# Patient Record
Sex: Female | Born: 1980 | Marital: Single | Smoking: Current every day smoker
Health system: Southern US, Community
[De-identification: ages and names within clinical notes are randomized; demographics above are authoritative.]

---

## 2002-03-16 ENCOUNTER — Inpatient Hospital Stay (HOSPITAL_COMMUNITY): Admission: AD | Admit: 2002-03-16 | Discharge: 2002-03-16 | Payer: Self-pay | Admitting: *Deleted

## 2004-06-22 ENCOUNTER — Ambulatory Visit (HOSPITAL_COMMUNITY): Admission: RE | Admit: 2004-06-22 | Discharge: 2004-06-22 | Payer: Self-pay | Admitting: Gynecology

## 2004-07-27 ENCOUNTER — Observation Stay (HOSPITAL_COMMUNITY): Admission: RE | Admit: 2004-07-27 | Discharge: 2004-07-28 | Payer: Self-pay | Admitting: Gynecology

## 2006-04-18 ENCOUNTER — Emergency Department: Payer: Self-pay | Admitting: Unknown Physician Specialty

## 2008-08-01 ENCOUNTER — Emergency Department: Payer: Self-pay | Admitting: Emergency Medicine

## 2009-12-02 ENCOUNTER — Emergency Department: Payer: Self-pay | Admitting: Unknown Physician Specialty

## 2010-01-04 ENCOUNTER — Inpatient Hospital Stay: Payer: Self-pay | Admitting: Psychiatry

## 2011-12-09 ENCOUNTER — Emergency Department: Payer: Self-pay | Admitting: *Deleted

## 2011-12-09 LAB — CBC WITH DIFFERENTIAL/PLATELET
Basophil %: 0.5 %
Eosinophil %: 0.5 %
Lymphocyte #: 3.1 10*3/uL (ref 1.0–3.6)
Lymphocyte %: 18.1 %
MCH: 33.7 pg (ref 26.0–34.0)
MCHC: 35.2 g/dL (ref 32.0–36.0)
Monocyte %: 6.5 %
Neutrophil #: 12.7 10*3/uL — ABNORMAL HIGH (ref 1.4–6.5)
Neutrophil %: 74.4 %
Platelet: 123 10*3/uL — ABNORMAL LOW (ref 150–440)
RDW: 13.3 % (ref 11.5–14.5)

## 2011-12-09 LAB — URINALYSIS, COMPLETE
Bilirubin,UR: NEGATIVE
Glucose,UR: NEGATIVE mg/dL (ref 0–75)
Ketone: NEGATIVE
Nitrite: NEGATIVE
Squamous Epithelial: <1

## 2011-12-09 LAB — BASIC METABOLIC PANEL
Anion Gap: 9 (ref 7–16)
Chloride: 107 mmol/L (ref 98–107)
Co2: 24 mmol/L (ref 21–32)
Creatinine: 0.8 mg/dL (ref 0.60–1.30)
EGFR (African American): >60
Osmolality: 278 (ref 275–301)

## 2011-12-11 ENCOUNTER — Emergency Department: Payer: Self-pay | Admitting: Emergency Medicine

## 2011-12-21 DIAGNOSIS — F0781 Postconcussional syndrome: Secondary | ICD-10-CM | POA: Insufficient documentation

## 2011-12-21 DIAGNOSIS — M542 Cervicalgia: Secondary | ICD-10-CM | POA: Insufficient documentation

## 2011-12-21 DIAGNOSIS — F17200 Nicotine dependence, unspecified, uncomplicated: Secondary | ICD-10-CM | POA: Insufficient documentation

## 2011-12-22 ENCOUNTER — Emergency Department (HOSPITAL_COMMUNITY)
Admission: EM | Admit: 2011-12-22 | Discharge: 2011-12-22 | Disposition: A | Discharge status: Home / Self Care | Payer: Self-pay | Attending: Emergency Medicine | Admitting: Emergency Medicine

## 2011-12-22 ENCOUNTER — Emergency Department (HOSPITAL_COMMUNITY): Payer: Self-pay

## 2011-12-22 ENCOUNTER — Encounter (HOSPITAL_COMMUNITY): Payer: Self-pay | Admitting: *Deleted

## 2011-12-22 DIAGNOSIS — F0781 Postconcussional syndrome: Secondary | ICD-10-CM

## 2011-12-22 MED ORDER — ONDANSETRON 4 MG PO TBDP
8.0000 mg | ORAL_TABLET | Freq: Once | ORAL | Status: AC
  Administered 2011-12-22: 8 mg via ORAL
  Filled 2011-12-22: qty 2

## 2011-12-22 MED ORDER — ONDANSETRON HCL 4 MG PO TABS: 4.0000 mg | ORAL_TABLET | Freq: Four times a day (QID) | ORAL | Status: AC

## 2011-12-22 MED ORDER — LORAZEPAM 1 MG PO TABS: 1.0000 mg | ORAL_TABLET | Freq: Three times a day (TID) | ORAL | Status: AC | PRN
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
## 2011-12-22 NOTE — ED Notes (Signed)
Headache for several days and she has many other personal issues .  N.  lmp  hyst  2006

## 2011-12-22 NOTE — ED Provider Notes (Signed)
History     CSN: [id number]  Arrival date & time 12/21/11  2352   First MD Initiated Contact with Patient 12/22/11 0026      Chief Complaint  Patient presents with  . Headache    (Consider location/radiation/quality/duration/timing/severity/associated sxs/prior treatment) HPI Comments: Patient presents with unilateral head pain and associated intermittent right-sided paresthesias. She reports two separate incidents of head trauma; the first was 5 days ago with no LOC and the second 3 days ago with a few seconds of LOC. Since the incidents, she has been experiencing persistent right sided, throbbing head pain with intermittent periods of right sided paresthesias and short term memory loss. She reports feeling nauseous which is exacerbated by abducting her eyes to the right. She reports current life stressors in conversation and is upset about them. She denies chest pain, SOB, vomiting, diarrhea, and abdominal pain.   Patient is a 31 y.o. female presenting with headaches.  Headache  Associated symptoms include nausea. Pertinent negatives include no fever, no shortness of breath and no vomiting.    History reviewed. No pertinent past medical history.  History reviewed. No pertinent past surgical history.  No family history on file.  History  Substance Use Topics  . Smoking status: Current Everyday Smoker  . Smokeless tobacco: Not on file  . Alcohol Use: No    OB History    Grav Para Term Preterm Abortions TAB SAB Ect Mult Living                  Review of Systems  Constitutional: Negative for fever, diaphoresis and fatigue.  HENT: Positive for neck pain. Negative for sore throat and trouble swallowing.   Eyes: Positive for photophobia. Negative for visual disturbance.  Respiratory: Negative for chest tightness and shortness of breath.   Cardiovascular: Negative for chest pain.  Gastrointestinal: Positive for nausea. Negative for vomiting, abdominal pain, diarrhea and  abdominal distention.  Musculoskeletal: Positive for back pain and gait problem.  Skin: Negative for pallor and wound.  Neurological: Positive for dizziness and headaches. Negative for syncope and weakness.    Allergies  Acetaminophen; Ibuprofen; Adhesive; Latex; and Other  Home Medications   Current Outpatient Rx  Name Route Sig Dispense Refill  . TRIPLE ANTIBIOTIC [phone] EX OINT Topical Apply 1 application topically 2 (two) times daily as needed. Apply to scarred areas    . OVER THE COUNTER MEDICATION Oral Take 1 tablet by mouth daily. Glucose and potassium supplement    . VITAMIN B12 100 MCG PO TABS Oral Take 100 mcg by mouth daily.      BP 121/78  Pulse 103  Temp 98.6 F (37 C) (Oral)  Resp 14  SpO2 100%  Physical Exam  Nursing note and vitals reviewed. Constitutional: She is oriented to person, place, and time. She appears well-developed and well-nourished.       Appears upset and tearful.   HENT:  Head: Normocephalic and atraumatic.  Mouth/Throat: Oropharynx is clear and moist. No oropharyngeal exudate.  Eyes: Conjunctivae are normal. Pupils are equal, round, and reactive to light. No scleral icterus.       Patient becomes nauseous with EOM to the right.   Neck: Normal range of motion.       Tenderness to palpation of neck lateral of the spine.   Cardiovascular: Normal rate and regular rhythm.  Exam reveals no gallop and no friction rub.   No murmur heard. Pulmonary/Chest: Effort normal and breath sounds normal. No respiratory distress. She has  no wheezes. She has no rales. She exhibits no tenderness.  Abdominal: Soft. There is no tenderness.  Musculoskeletal: Normal range of motion.  Neurological: She is alert and oriented to person, place, and time. No cranial nerve deficit.       Strength and sensation equal and intact bilaterally.   Skin: Skin is warm and dry. No rash noted. She is not diaphoretic. No pallor.  Psychiatric: Her behavior is normal.        Depressed mood.     ED Course  Procedures (including critical care time)  Labs Reviewed - No data to display No results found.   No diagnosis found.    MDM  12:46 AM Patient will have head and neck CT to evaluate possible injury.   1:03 AM Patient signed out to Dr. Norlene Campbell.      Emilia Beck, PA-C 12/22/11 [phone]

## 2011-12-30 ENCOUNTER — Emergency Department: Payer: Self-pay | Admitting: Internal Medicine

## 2011-12-30 LAB — CBC
HCT: 40.3 % (ref 35.0–47.0)
HGB: 13.8 g/dL (ref 12.0–16.0)
MCH: 32.7 pg (ref 26.0–34.0)
MCHC: 34.2 g/dL (ref 32.0–36.0)
RBC: 4.22 10*6/uL (ref 3.80–5.20)

## 2011-12-30 LAB — URINALYSIS, COMPLETE
Hyaline Cast: 29
Nitrite: POSITIVE
Ph: 5 (ref 4.5–8.0)
Protein: 100
Specific Gravity: 1.017 (ref 1.003–1.030)

## 2011-12-30 LAB — COMPREHENSIVE METABOLIC PANEL
Albumin: 4 g/dL (ref 3.4–5.0)
Alkaline Phosphatase: 62 U/L (ref 50–136)
Calcium, Total: 9 mg/dL (ref 8.5–10.1)
Glucose: 188 mg/dL — ABNORMAL HIGH (ref 65–99)
SGOT(AST): 32 U/L (ref 15–37)
SGPT (ALT): 22 U/L (ref 12–78)
Sodium: 139 mmol/L (ref 136–145)

## 2011-12-30 LAB — DRUG SCREEN, URINE
Benzodiazepine, Ur Scrn: NEGATIVE (ref ?–200)
Cannabinoid 50 Ng, Ur ~~LOC~~: POSITIVE (ref ?–50)
MDMA (Ecstasy)Ur Screen: NEGATIVE (ref ?–500)
Opiate, Ur Screen: NEGATIVE (ref ?–300)
Phencyclidine (PCP) Ur S: NEGATIVE (ref ?–25)

## 2011-12-30 LAB — ACETAMINOPHEN LEVEL: Acetaminophen: <2 ug/mL

## 2012-01-08 ENCOUNTER — Emergency Department: Payer: Self-pay | Admitting: Emergency Medicine

## 2013-07-12 IMAGING — CT CT CERVICAL SPINE W/O CM
4 of 6 series · 13 of 33 positions shown, 15 images · non-contrast
Comparison: None

CT HEAD

CLINICAL DATA: Trauma, head pain, neck pain

CT HEAD WITHOUT CONTRAST
CT CERVICAL SPINE WITHOUT CONTRAST
TECHNIQUE: Multidetector CT imaging of the head and cervical spine
was performed following the standard protocol without intravenous
contrast.  Multiplanar CT image reconstructions of the cervical
spine were also generated.

[Series 6: c_spine 2.0 b31s detail · axial · 0.23mm/px · z∈[+1016,+1102]mm · 3 of 87 slices shown, 4 images]
[im 22/87  soft-tissue]
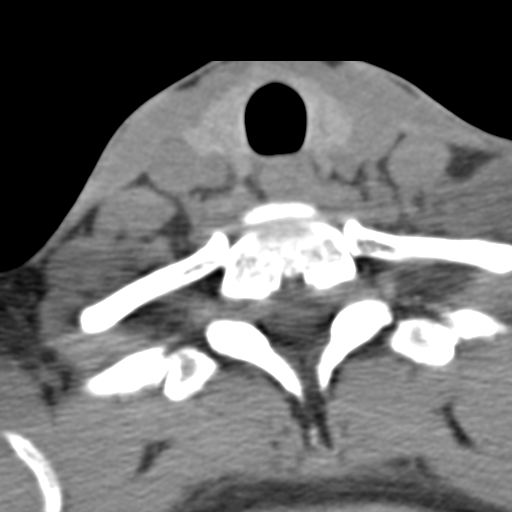
[im 22/87  bone]
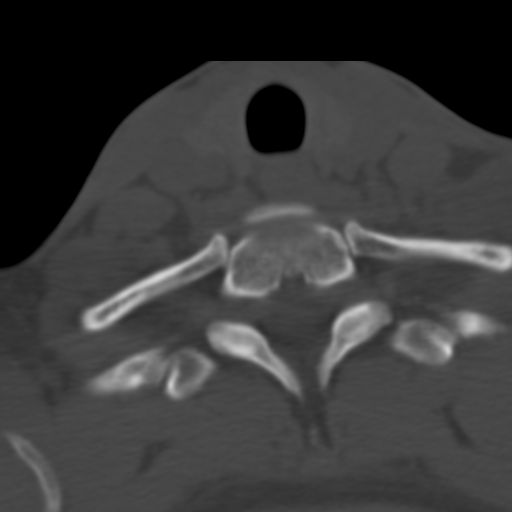
[im 44/87  bone]
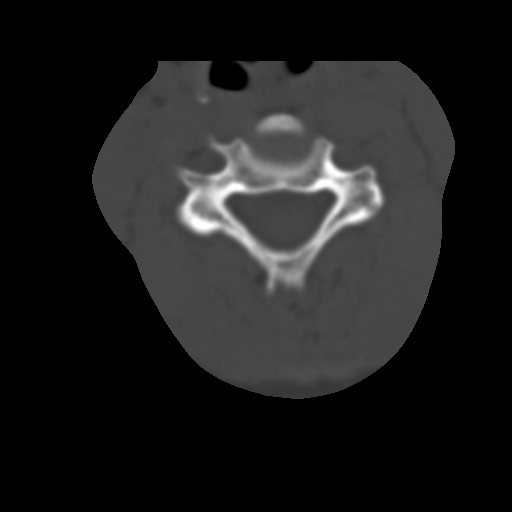
[im 65/87  bone]
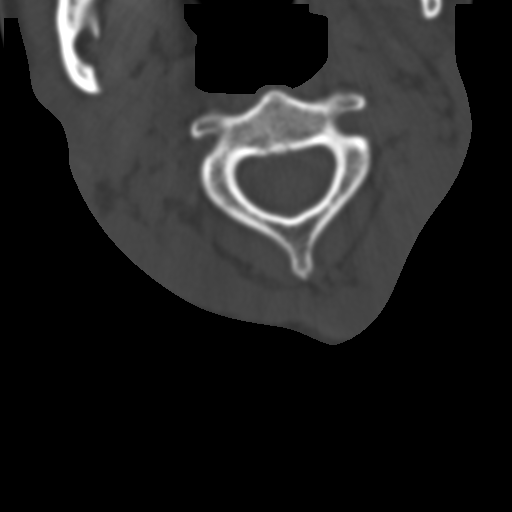

[Series 602: orthog · axial · 0.34mm/px · z∈[+1000,+1041]mm · 2 of 69 slices shown]
[im 23/69  bone]
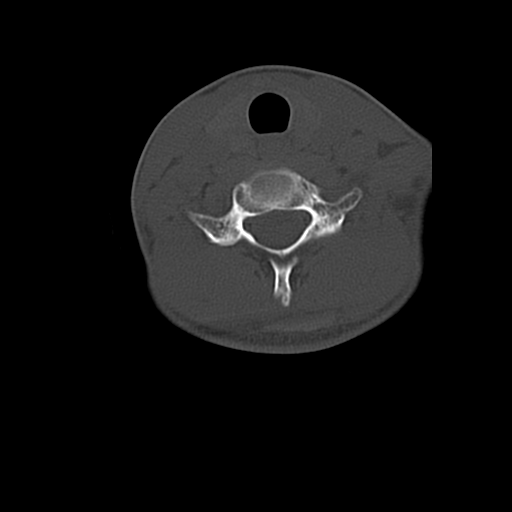
[im 46/69  bone]
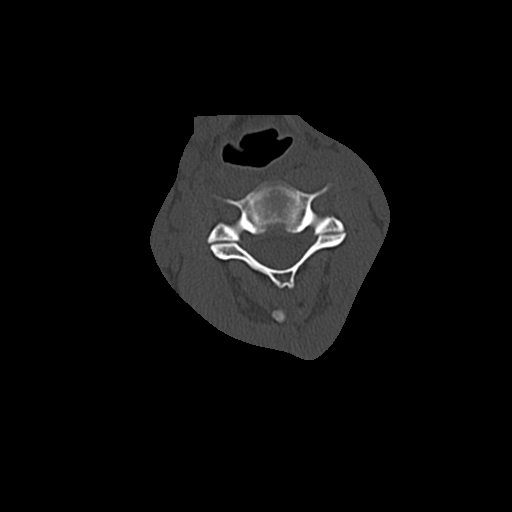

[Series 603: cor · coronal · 0.34mm/px · 3 of 31 slices shown]
[im 7/31  bone]
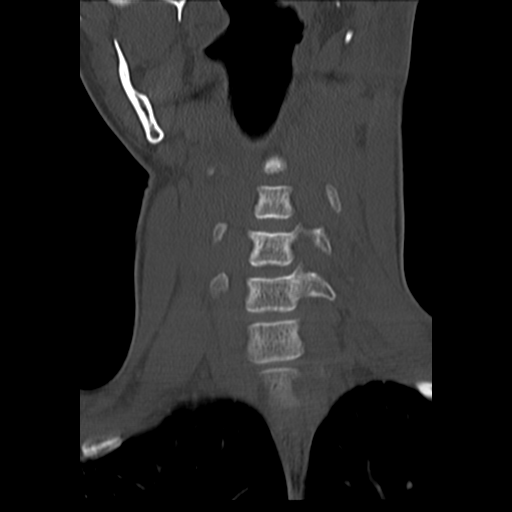
[im 13/31  bone]
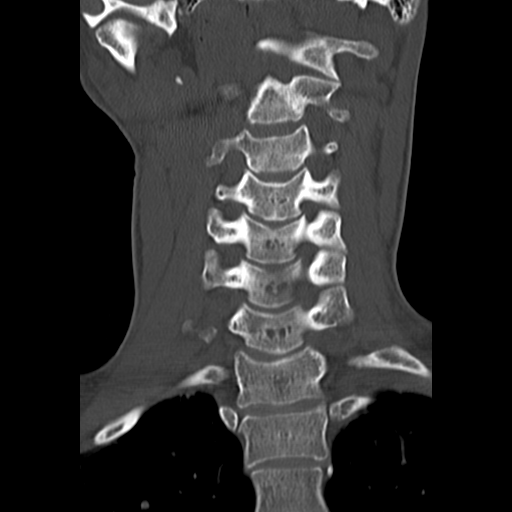
[im 19/31  bone]
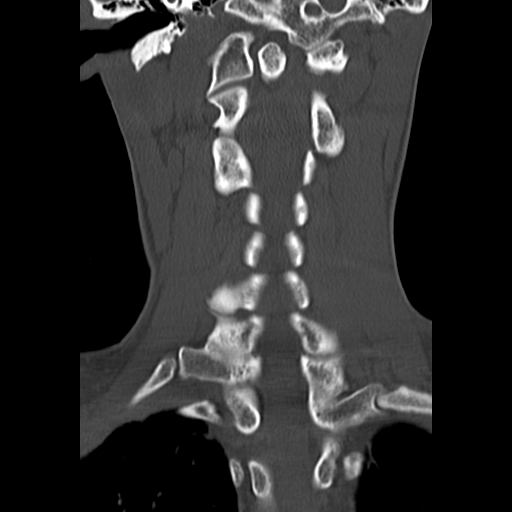

[Series 604: sag · sagittal · 0.34mm/px · 5 of 34 slices shown, 6 images]
[im 12/34  bone]
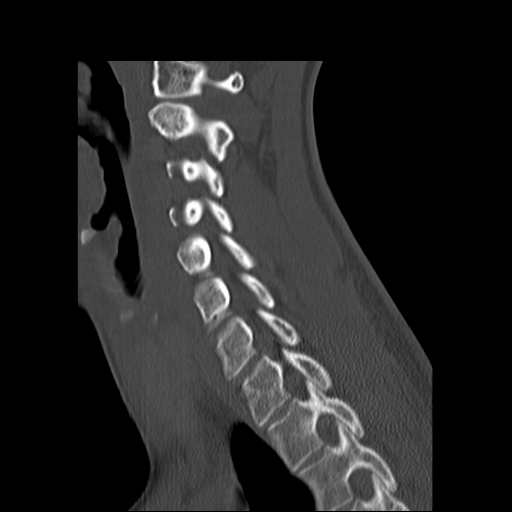
[im 14/34  bone]
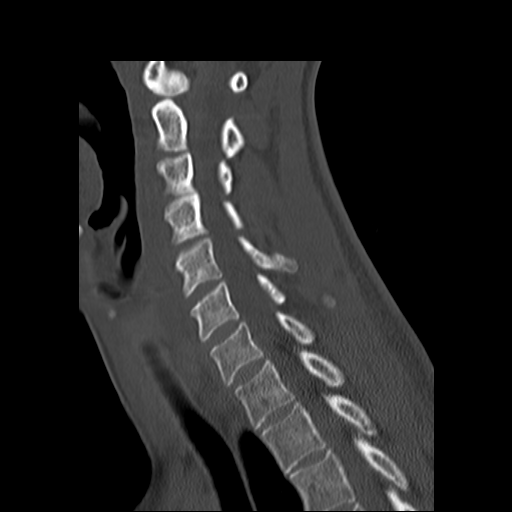
[im 17/34  soft-tissue]
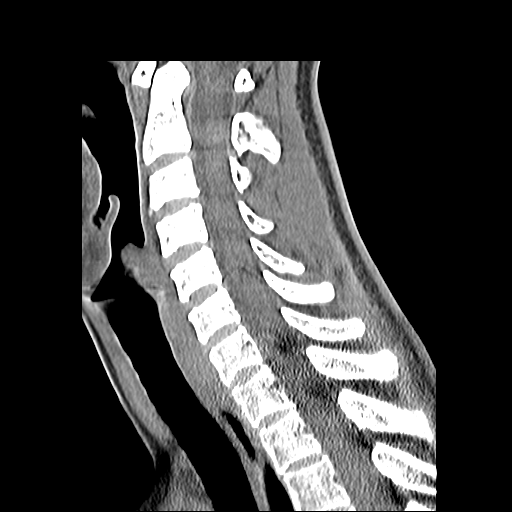
[im 17/34  bone]
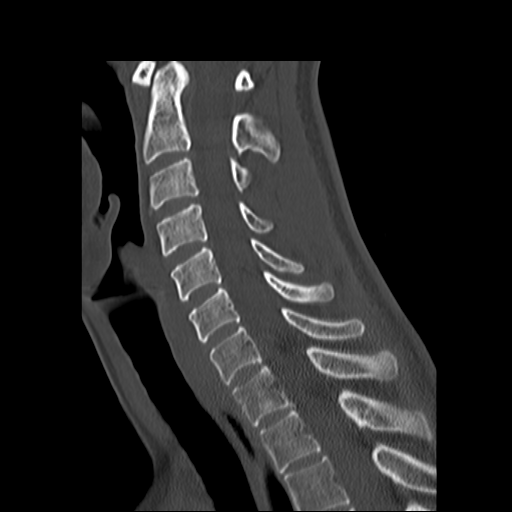
[im 20/34  bone]
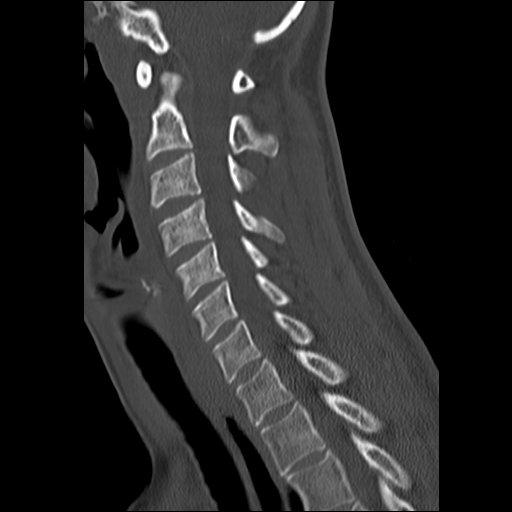
[im 23/34  bone]
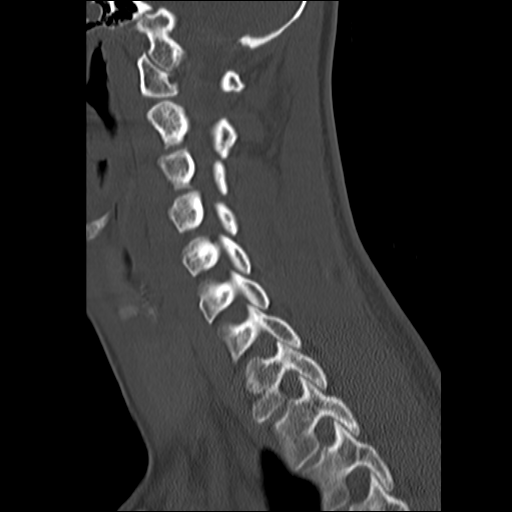

[13 of 33 positions shown; findings below may reference images not displayed]

FINDINGS: No intracranial hemorrhage.  No parenchymal contusion.
No midline shift or mass effect.  Basilar cisterns are patent. No
skull base fracture.  No fluid in the paranasal sinuses or mastoid
air cells.
IMPRESSION: No intracranial trauma.

CT CERVICAL SPINE
FINDINGS: No prevertebral soft tissue swelling.  There is
irregularity of the inferior anterior endplate at C3 which is felt
to be chronic.  No acute loss vertebral body height or disc height.
Normal craniocervical junction.  No evidence epidural orparaspinal
hematoma.
IMPRESSION: No evidence of cervical spine fracture.

## 2014-08-30 NOTE — Consult Note (Signed)
Brief Consult Note: Diagnosis: Bipolar affective disorder by history.   Patient was seen by consultant.   Consult note dictated.   Recommend further assessment or treatment.   Orders entered.   Discussed with Attending MD.   Comments: [name] has a h/o bipolar illness. She has been off the Lithium for a year. She was initially agitated in the ER then oversedated after medications, and hard to interview. We started her on Lithium and Risperdal for bipolar illness. She accepted medication in the ER but makes it clear that she is not going to continue them following discharge. She also declines an appointment with a psychiatrist.  MSE: She is alert, oriented, cool collected, pleasant, cooperative. No suicidal or homicidal ideations, no psychotic symptoms. Fair insight and judgment.   PLAN: 1. The patient no longer meets criteria for IVC. [facility] will terminate proceedings. Please discharge as appropriate.    2. I will not prescribe any medications as the patient refuses to take it.   3. We will provide her with information about resources in the community. We will not make an appointment for her as she has no intention to f/u at present.  Electronic Signatures: Kristine LineaPucilowska, [place] (MD)  (Signed 20-Aug-13 12:09)  Authored: Brief Consult Note   Last Updated: 20-Aug-13 12:09 by Kristine LineaPucilowska, Elandra Powell (MD)

## 2014-08-30 NOTE — Consult Note (Signed)
Brief Consult Note: Diagnosis: Bipolar affective disorder.   Patient was seen by consultant.   Consult note dictated.   Recommend further assessment or treatment.   Orders entered.   Discussed with Attending MD.   Comments: [name] has a h/o bipolar illness. She has been off the Lithium for a year. She was initially agitated in the ER then oversedated after medications, hard to interview. We were unable to contact the family.   PLAN: 1. Bipolar. Will start Lithium and Risperdal.   2. Collateral data from family to establish if she needs admission.   3. I will follow up.  Electronic Signatures: Jenna Brady, Jenna Brady (MD)  (Signed 19-Aug-13 23:04)  Authored: Brief Consult Note   Last Updated: 19-Aug-13 23:04 by Jenna Brady, Jenna Brady (MD)
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
## 2017-04-08 ENCOUNTER — Ambulatory Visit: Payer: Self-pay | Admitting: Unknown Physician Specialty
